# Patient Record
Sex: Female | Born: 1965 | Race: White | Hispanic: No | Marital: Married
Health system: Southern US, Community
[De-identification: ages and names within clinical notes are randomized; demographics above are authoritative.]

---

## 1997-09-24 ENCOUNTER — Encounter: Admission: RE | Admit: 1997-09-24 | Discharge: 1997-12-23 | Payer: Self-pay | Admitting: Gynecology

## 1998-02-04 ENCOUNTER — Encounter: Admission: RE | Admit: 1998-02-04 | Discharge: 1998-05-05 | Payer: Self-pay | Admitting: Gynecology

## 1998-04-04 ENCOUNTER — Inpatient Hospital Stay (HOSPITAL_COMMUNITY): Admission: AD | Admit: 1998-04-04 | Discharge: 1998-04-07 | Payer: Self-pay | Admitting: Gynecology

## 1998-04-08 ENCOUNTER — Encounter (HOSPITAL_COMMUNITY): Admission: RE | Admit: 1998-04-08 | Discharge: 1998-04-23 | Payer: Self-pay | Admitting: *Deleted

## 1998-05-19 ENCOUNTER — Other Ambulatory Visit: Admission: RE | Admit: 1998-05-19 | Discharge: 1998-05-19 | Payer: Self-pay | Admitting: Gynecology

## 2009-03-17 ENCOUNTER — Encounter: Admission: RE | Admit: 2009-03-17 | Discharge: 2009-03-17 | Payer: Self-pay | Admitting: Obstetrics and Gynecology

## 2010-02-06 ENCOUNTER — Encounter: Admission: RE | Admit: 2010-02-06 | Discharge: 2010-02-06 | Payer: Self-pay | Admitting: Orthopedic Surgery

## 2010-09-23 ENCOUNTER — Other Ambulatory Visit: Payer: Self-pay | Admitting: Obstetrics and Gynecology

## 2010-09-23 DIAGNOSIS — Z1231 Encounter for screening mammogram for malignant neoplasm of breast: Secondary | ICD-10-CM

## 2010-10-27 ENCOUNTER — Ambulatory Visit
Admission: RE | Admit: 2010-10-27 | Discharge: 2010-10-27 | Disposition: A | Discharge status: Home / Self Care | Payer: BC Managed Care – PPO | Source: Ambulatory Visit | Attending: Obstetrics and Gynecology | Admitting: Obstetrics and Gynecology

## 2010-10-27 DIAGNOSIS — Z1231 Encounter for screening mammogram for malignant neoplasm of breast: Secondary | ICD-10-CM

## 2011-07-14 ENCOUNTER — Other Ambulatory Visit: Payer: Self-pay | Admitting: Neurological Surgery

## 2011-07-14 DIAGNOSIS — M545 Low back pain: Secondary | ICD-10-CM

## 2011-07-18 ENCOUNTER — Ambulatory Visit
Admission: RE | Admit: 2011-07-18 | Discharge: 2011-07-18 | Disposition: A | Discharge status: Home / Self Care | Payer: BC Managed Care – PPO | Source: Ambulatory Visit | Attending: Neurological Surgery | Admitting: Neurological Surgery

## 2011-07-18 DIAGNOSIS — M549 Dorsalgia, unspecified: Secondary | ICD-10-CM

## 2011-07-18 DIAGNOSIS — M545 Low back pain: Secondary | ICD-10-CM

## 2011-07-18 MED ORDER — IOHEXOL 180 MG/ML  SOLN
1.0000 mL | Freq: Once | INTRAMUSCULAR | Status: AC | PRN
  Administered 2011-07-18: 1 mL via EPIDURAL

## 2011-07-18 MED ORDER — METHYLPREDNISOLONE ACETATE 40 MG/ML INJ SUSP (RADIOLOG
120.0000 mg | Freq: Once | INTRAMUSCULAR | Status: AC
  Administered 2011-07-18: 120 mg via EPIDURAL

## 2011-11-08 ENCOUNTER — Other Ambulatory Visit: Payer: Self-pay | Admitting: Obstetrics and Gynecology

## 2011-11-08 DIAGNOSIS — Z1231 Encounter for screening mammogram for malignant neoplasm of breast: Secondary | ICD-10-CM

## 2011-11-23 ENCOUNTER — Ambulatory Visit
Admission: RE | Admit: 2011-11-23 | Discharge: 2011-11-23 | Disposition: A | Discharge status: Home / Self Care | Payer: BC Managed Care – PPO | Source: Ambulatory Visit | Attending: Obstetrics and Gynecology | Admitting: Obstetrics and Gynecology

## 2011-11-23 DIAGNOSIS — Z1231 Encounter for screening mammogram for malignant neoplasm of breast: Secondary | ICD-10-CM

## 2012-06-30 ENCOUNTER — Encounter (HOSPITAL_COMMUNITY): Payer: Self-pay | Admitting: Emergency Medicine

## 2012-06-30 ENCOUNTER — Emergency Department (HOSPITAL_COMMUNITY)
Admission: EM | Admit: 2012-06-30 | Discharge: 2012-06-30 | Disposition: A | Discharge status: Home Health | Payer: BC Managed Care – PPO | Attending: Emergency Medicine | Admitting: Emergency Medicine

## 2012-06-30 DIAGNOSIS — R040 Epistaxis: Secondary | ICD-10-CM | POA: Insufficient documentation

## 2012-06-30 DIAGNOSIS — J3489 Other specified disorders of nose and nasal sinuses: Secondary | ICD-10-CM | POA: Insufficient documentation

## 2012-06-30 MED ORDER — OXYMETAZOLINE HCL 0.05 % NA SOLN: 2.0000 | Freq: Two times a day (BID) | NASAL | Status: DC

## 2012-06-30 NOTE — ED Notes (Signed)
Pt presents to ED via POV with c/o nosebleeds approx 6-8 in the past 24 hours. No bleeding noted on arrival to ED. NAD.

## 2012-06-30 NOTE — ED Provider Notes (Signed)
History     CSN: [id number]  Arrival date & time 06/30/12  0707   First MD Initiated Contact with Patient 06/30/12 0715      Chief Complaint  Patient presents with  . Epistaxis    (Consider location/radiation/quality/duration/timing/severity/associated sxs/prior treatment) HPI Comments: Patient with complaints of several nosebleeds over the past 24 hours. She is not currently bleeding. She does not typically have nosebleeds and she's not on any blood thinning medications. Patient states that she can control symptoms after several minutes of pressure. She has had several tissues full of blood at times with clotting. Patient states that initially the blood was coming from the right nare however more recently it was coming out of both. She has not felt lightheaded or passed out. No other treatments other than pressure. She has had sinusitis symptoms as of the past few days. No other complaints. Onset acute. Course is resolved. Nothing makes symptoms better or worse.  The history is provided by the patient.    History reviewed. No pertinent past medical history.  History reviewed. No pertinent past surgical history.  History reviewed. No pertinent family history.  History  Substance Use Topics  . Smoking status: Not on file  . Smokeless tobacco: Not on file  . Alcohol Use: Not on file    OB History    Grav Para Term Preterm Abortions TAB SAB Ect Mult Living                  Review of Systems  Constitutional: Negative for fever, chills and fatigue.  HENT: Positive for nosebleeds, congestion, rhinorrhea and sinus pressure. Negative for ear pain, sore throat and neck stiffness.   Respiratory: Negative for shortness of breath.   Gastrointestinal: Negative for nausea and vomiting.  Skin: Negative for color change.  Neurological: Negative for syncope and light-headedness.    Allergies  Review of patient's allergies indicates no known allergies.  Home Medications   Current  Outpatient Rx  Name  Route  Sig  Dispense  Refill  . OXYMETAZOLINE HCL 0.05 % NA SOLN   Nasal   Place 2 sprays into the nose 2 (two) times daily.   30 mL   0     Do not use more than 3 consecutive days.     BP 131/84  Pulse 70  Temp 97.6 F (36.4 C) (Oral)  Resp 17  SpO2 100%  Physical Exam  Nursing note and vitals reviewed. Constitutional: She appears well-developed and well-nourished.  HENT:  Head: Normocephalic and atraumatic. No trismus in the jaw.  Right Ear: Tympanic membrane, external ear and ear canal normal.  Left Ear: Tympanic membrane, external ear and ear canal normal.  Nose: Mucosal edema and rhinorrhea present. No epistaxis.  Mouth/Throat: Uvula is midline, oropharynx is clear and moist and mucous membranes are normal. Mucous membranes are not dry. No oral lesions. No uvula swelling. No oropharyngeal exudate, posterior oropharyngeal edema, posterior oropharyngeal erythema or tonsillar abscesses.       Blood vessels inside right nare anteriorly appear engorged however no bleeding lesions noted in either nare. No active bleeding.  Eyes: Conjunctivae normal are normal. Right eye exhibits no discharge. Left eye exhibits no discharge.       No conjunctival pallor.  Neck: Normal range of motion. Neck supple.  Cardiovascular: Normal rate and regular rhythm.   Lymphadenopathy:    She has no cervical adenopathy.  Neurological: She is alert.  Skin: Skin is warm and dry.  Psychiatric: She has  a normal mood and affect.    ED Course  Procedures (including critical care time)  Labs Reviewed - No data to display No results found.   1. Epistaxis     7:34 AM Patient seen and examined. Patient counseled to use Afrin nasal spray twice a day for 3 days. She will also use petroleum based products to moisturize inside of nose. Counseled on techniques to stop nosebleed.   Vital signs reviewed and are as follows: Filed Vitals:   06/30/12 0712  BP: 131/84  Pulse: 70    Temp: 97.6 F (36.4 C)  Resp: 17      MDM  Patient with recent nosebleeds, well controlled in emergency department. Suspect posterior nosebleed given history and exam. This is likely due to a sinus infection and cold dry air. Patient is not on any blood thinners. She does not exhibit any signs or symptoms of anemia.        Renne Crigler, [place] 06/30/12 [phone]

## 2012-06-30 NOTE — ED Notes (Signed)
Pt discharged to home NAD.  

## 2012-07-01 ENCOUNTER — Emergency Department (HOSPITAL_COMMUNITY)
Admission: EM | Admit: 2012-07-01 | Discharge: 2012-07-01 | Disposition: A | Discharge status: Home / Self Care | Payer: BC Managed Care – PPO | Source: Home / Self Care | Attending: Emergency Medicine | Admitting: Emergency Medicine

## 2012-07-01 ENCOUNTER — Encounter (HOSPITAL_COMMUNITY): Payer: Self-pay | Admitting: *Deleted

## 2012-07-01 DIAGNOSIS — R040 Epistaxis: Secondary | ICD-10-CM

## 2012-07-01 NOTE — ED Provider Notes (Signed)
Medical screening examination/treatment/procedure(s) were performed by non-physician practitioner and as supervising physician I was immediately available for consultation/collaboration.  Hillery Zachman, MD 07/01/12 1055 

## 2012-07-01 NOTE — ED Provider Notes (Signed)
History     CSN: [id number]  Arrival date & time 07/01/12  1316   First MD Initiated Contact with Patient 07/01/12 1346      Chief Complaint  Patient presents with  . Epistaxis    (Consider location/radiation/quality/duration/timing/severity/associated sxs/prior treatment) HPI Comments: Patient presents urgent care describing that she continues to have episodes of bleeding she suspects mainly coming from the right side of her nose. This morning she had about 2-3 episodes were easily controllable with pinching her nose and applying ice packs and she's still using Afrin as instructed by the emergency department. Patient denies any other symptoms such as bruising, dizziness, fevers, headaches,  "I'm just wondered if there something else we can do"  Patient is a 47 y.o. female presenting with nosebleeds. The history is provided by the patient.  Epistaxis  This is a recurrent problem. The current episode started 2 days ago. The problem occurs daily. The problem has not changed since onset.Associated with: Recent cold like symptoms. Treatments tried: Afrin. The treatment provided mild relief.    History reviewed. No pertinent past medical history.  History reviewed. No pertinent past surgical history.  No family history on file.  History  Substance Use Topics  . Smoking status: Not on file  . Smokeless tobacco: Not on file  . Alcohol Use: Not on file    OB History    Grav Para Term Preterm Abortions TAB SAB Ect Mult Living                  Review of Systems  Constitutional: Negative for chills, activity change and appetite change.  HENT: Positive for nosebleeds.   Gastrointestinal: Negative for nausea and rectal pain.  Skin: Negative for color change, pallor, rash and wound.  Neurological: Negative for dizziness and headaches.    Allergies  Review of patient's allergies indicates no known allergies.  Home Medications   Current Outpatient Rx  Name  Route  Sig   Dispense  Refill  . CYCLOBENZAPRINE HCL 10 MG PO TABS   Oral   Take 10 mg by mouth 3 (three) times daily as needed.         Marland Kitchen OXYMETAZOLINE HCL 0.05 % NA SOLN   Nasal   Place 2 sprays into the nose 2 (two) times daily.   30 mL   0     Do not use more than 3 consecutive days.     BP 120/81  Pulse 64  Temp 97.9 F (36.6 C) (Oral)  Resp 16  SpO2 100%  Physical Exam  Nursing note and vitals reviewed. Constitutional: She appears well-developed and well-nourished. No distress.  HENT:  Head: Normocephalic.  Nose: No mucosal edema, rhinorrhea, nose lacerations, sinus tenderness, nasal deformity, septal deviation or nasal septal hematoma. No epistaxis.    Mouth/Throat: No oropharyngeal exudate.  Eyes: Conjunctivae normal and EOM are normal. Pupils are equal, round, and reactive to light. Right eye exhibits no discharge. Left eye exhibits no discharge. No scleral icterus.  Neck: Neck supple.  Neurological: She is alert.  Skin: No rash noted. No pallor.    ED Course  Procedures (including critical care time)  Labs Reviewed - No data to display No results found.   1. Epistaxis, recurrent       MDM  During urgent care encounter patient had no active bleeding. Mild erythema noted to right nostril. No blood clots. No evidence of recent post pharyngeal evidence of bleeding. We discuss further measures to take to prevent  recurrences and episodes. Including aggressive nasal mucosa hydration with the use of normal saline and Vaseline to prevent physical efforts, protect herself against extreme temperature changes of rest heat and cold. Patient understands we'll pursue further precautionary measures. No active epistaxis        Jimmie Molly, MD 07/01/12 1557

## 2012-07-01 NOTE — ED Notes (Signed)
Patient complains of nose bleeds starting Thursday. Patient states she went to the ED on Saturday after having 12 or more nose bleeds in a row from Friday to Saturday was sent home with afrin. This morning patient stats she had 3 more nose bleeds that wouldn't stop. Patient denies nausea, vomiting, diarrhea, fever/chills.

## 2012-12-10 ENCOUNTER — Other Ambulatory Visit: Payer: Self-pay

## 2012-12-10 DIAGNOSIS — Z1231 Encounter for screening mammogram for malignant neoplasm of breast: Secondary | ICD-10-CM

## 2013-01-10 ENCOUNTER — Ambulatory Visit
Admission: RE | Admit: 2013-01-10 | Discharge: 2013-01-10 | Disposition: A | Discharge status: Home / Self Care | Payer: BC Managed Care – PPO | Source: Ambulatory Visit

## 2013-01-10 DIAGNOSIS — Z1231 Encounter for screening mammogram for malignant neoplasm of breast: Secondary | ICD-10-CM

## 2013-12-09 ENCOUNTER — Other Ambulatory Visit: Payer: Self-pay

## 2013-12-09 DIAGNOSIS — Z1231 Encounter for screening mammogram for malignant neoplasm of breast: Secondary | ICD-10-CM

## 2014-02-04 ENCOUNTER — Ambulatory Visit
Admission: RE | Admit: 2014-02-04 | Discharge: 2014-02-04 | Disposition: A | Discharge status: Home / Self Care | Payer: BC Managed Care – PPO | Source: Ambulatory Visit

## 2014-02-04 DIAGNOSIS — Z1231 Encounter for screening mammogram for malignant neoplasm of breast: Secondary | ICD-10-CM

## 2015-03-24 ENCOUNTER — Other Ambulatory Visit: Payer: Self-pay

## 2015-03-24 DIAGNOSIS — Z1231 Encounter for screening mammogram for malignant neoplasm of breast: Secondary | ICD-10-CM

## 2015-04-20 ENCOUNTER — Ambulatory Visit: Payer: Self-pay

## 2015-04-20 ENCOUNTER — Ambulatory Visit
Admission: RE | Admit: 2015-04-20 | Discharge: 2015-04-20 | Disposition: A | Discharge status: Home / Self Care | Payer: BLUE CROSS/BLUE SHIELD | Source: Ambulatory Visit

## 2015-04-20 DIAGNOSIS — Z1231 Encounter for screening mammogram for malignant neoplasm of breast: Secondary | ICD-10-CM

## 2016-06-15 ENCOUNTER — Other Ambulatory Visit: Payer: Self-pay | Admitting: Obstetrics and Gynecology

## 2016-06-15 DIAGNOSIS — Z1231 Encounter for screening mammogram for malignant neoplasm of breast: Secondary | ICD-10-CM

## 2016-07-11 ENCOUNTER — Ambulatory Visit: Payer: BLUE CROSS/BLUE SHIELD

## 2016-07-26 ENCOUNTER — Ambulatory Visit
Admission: RE | Admit: 2016-07-26 | Discharge: 2016-07-26 | Disposition: A | Discharge status: Home / Self Care | Payer: BLUE CROSS/BLUE SHIELD | Source: Ambulatory Visit | Attending: Obstetrics and Gynecology | Admitting: Obstetrics and Gynecology

## 2016-07-26 DIAGNOSIS — Z1231 Encounter for screening mammogram for malignant neoplasm of breast: Secondary | ICD-10-CM

## 2017-08-22 ENCOUNTER — Other Ambulatory Visit: Payer: Self-pay | Admitting: Obstetrics and Gynecology

## 2017-08-22 DIAGNOSIS — Z1231 Encounter for screening mammogram for malignant neoplasm of breast: Secondary | ICD-10-CM

## 2017-09-12 ENCOUNTER — Ambulatory Visit: Payer: BLUE CROSS/BLUE SHIELD

## 2018-06-04 DIAGNOSIS — M25775 Osteophyte, left foot: Secondary | ICD-10-CM | POA: Diagnosis not present

## 2018-06-04 DIAGNOSIS — L600 Ingrowing nail: Secondary | ICD-10-CM | POA: Diagnosis not present

## 2018-06-14 DIAGNOSIS — L600 Ingrowing nail: Secondary | ICD-10-CM | POA: Diagnosis not present

## 2019-02-26 DIAGNOSIS — H5203 Hypermetropia, bilateral: Secondary | ICD-10-CM | POA: Diagnosis not present

## 2019-03-06 ENCOUNTER — Other Ambulatory Visit: Payer: Self-pay | Admitting: Obstetrics and Gynecology

## 2019-03-06 DIAGNOSIS — Z1231 Encounter for screening mammogram for malignant neoplasm of breast: Secondary | ICD-10-CM

## 2019-04-23 ENCOUNTER — Ambulatory Visit
Admission: RE | Admit: 2019-04-23 | Discharge: 2019-04-23 | Disposition: A | Discharge status: Home / Self Care | Payer: BLUE CROSS/BLUE SHIELD | Source: Ambulatory Visit | Attending: Obstetrics and Gynecology | Admitting: Obstetrics and Gynecology

## 2019-04-23 ENCOUNTER — Other Ambulatory Visit: Payer: Self-pay

## 2019-04-23 DIAGNOSIS — Z1231 Encounter for screening mammogram for malignant neoplasm of breast: Secondary | ICD-10-CM

## 2019-09-19 DIAGNOSIS — Z0000 Encounter for general adult medical examination without abnormal findings: Secondary | ICD-10-CM | POA: Diagnosis not present

## 2019-09-19 DIAGNOSIS — Z01419 Encounter for gynecological examination (general) (routine) without abnormal findings: Secondary | ICD-10-CM | POA: Diagnosis not present

## 2019-09-19 DIAGNOSIS — Z136 Encounter for screening for cardiovascular disorders: Secondary | ICD-10-CM | POA: Diagnosis not present

## 2019-09-19 DIAGNOSIS — Z23 Encounter for immunization: Secondary | ICD-10-CM | POA: Diagnosis not present

## 2019-09-24 DIAGNOSIS — R5383 Other fatigue: Secondary | ICD-10-CM | POA: Diagnosis not present

## 2019-09-24 DIAGNOSIS — Z131 Encounter for screening for diabetes mellitus: Secondary | ICD-10-CM | POA: Diagnosis not present

## 2019-09-24 DIAGNOSIS — Z136 Encounter for screening for cardiovascular disorders: Secondary | ICD-10-CM | POA: Diagnosis not present

## 2019-09-24 DIAGNOSIS — Z0000 Encounter for general adult medical examination without abnormal findings: Secondary | ICD-10-CM | POA: Diagnosis not present

## 2020-05-13 ENCOUNTER — Other Ambulatory Visit: Payer: Self-pay | Admitting: Internal Medicine

## 2020-05-13 DIAGNOSIS — Z1231 Encounter for screening mammogram for malignant neoplasm of breast: Secondary | ICD-10-CM

## 2020-05-14 ENCOUNTER — Ambulatory Visit
Admission: RE | Admit: 2020-05-14 | Discharge: 2020-05-14 | Disposition: A | Discharge status: Home / Self Care | Payer: BC Managed Care – PPO | Source: Ambulatory Visit | Attending: Internal Medicine | Admitting: Internal Medicine

## 2020-05-14 ENCOUNTER — Other Ambulatory Visit: Payer: Self-pay

## 2020-05-14 DIAGNOSIS — Z1231 Encounter for screening mammogram for malignant neoplasm of breast: Secondary | ICD-10-CM

## 2020-09-29 IMAGING — MG DIGITAL SCREENING BILAT W/ TOMO
8 series · 8 of 24 positions shown · non-contrast
Comparison: Previous exam(s).

CLINICAL DATA: Screening.

EXAM:
DIGITAL SCREENING BILATERAL MAMMOGRAM WITH TOMO AND CAD

[R MLO synth-2D]
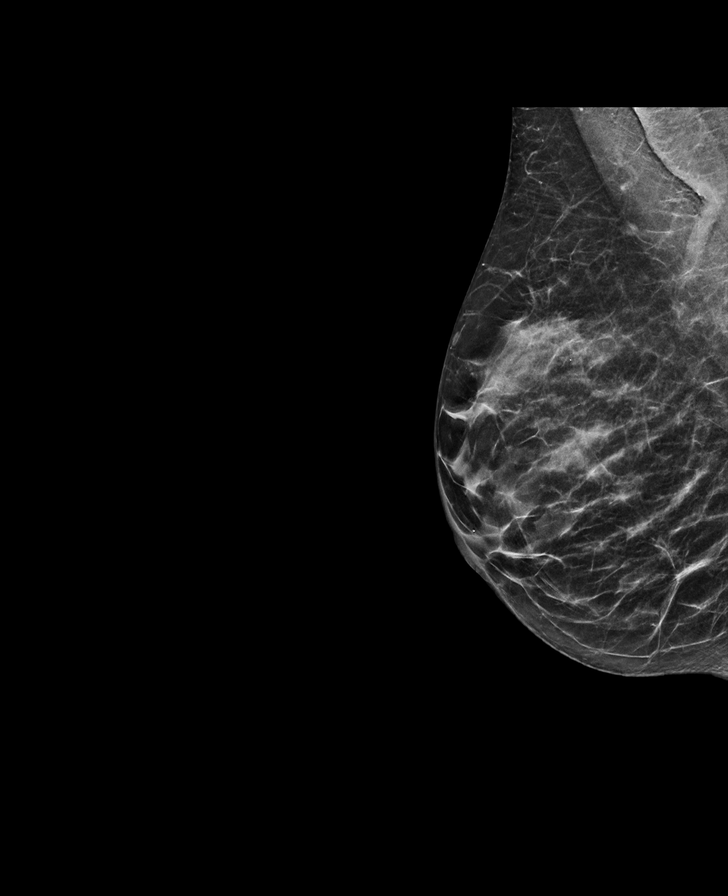

[R CC synth-2D]
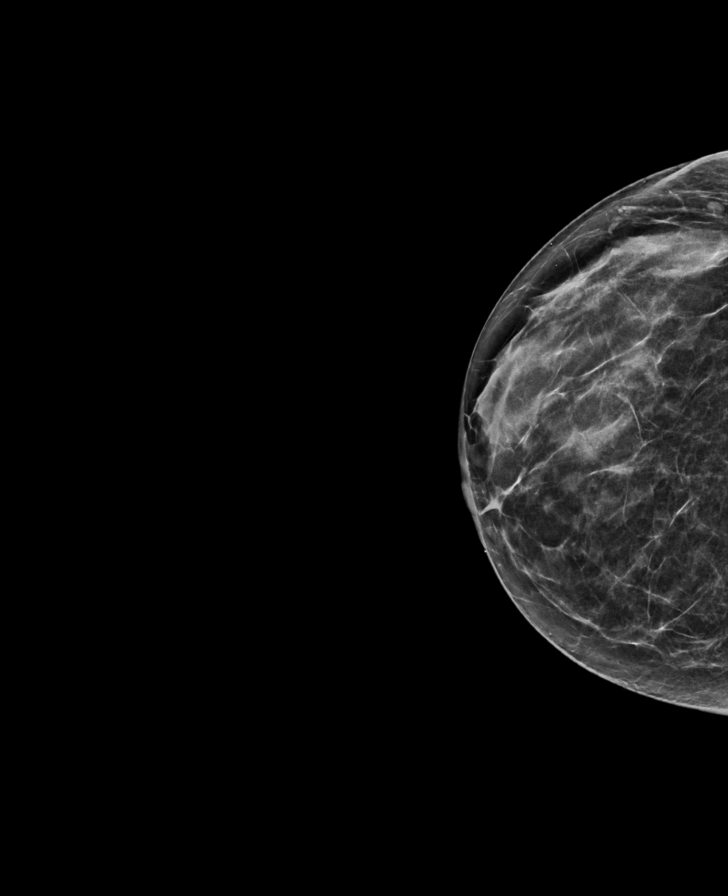

[L CC synth-2D]
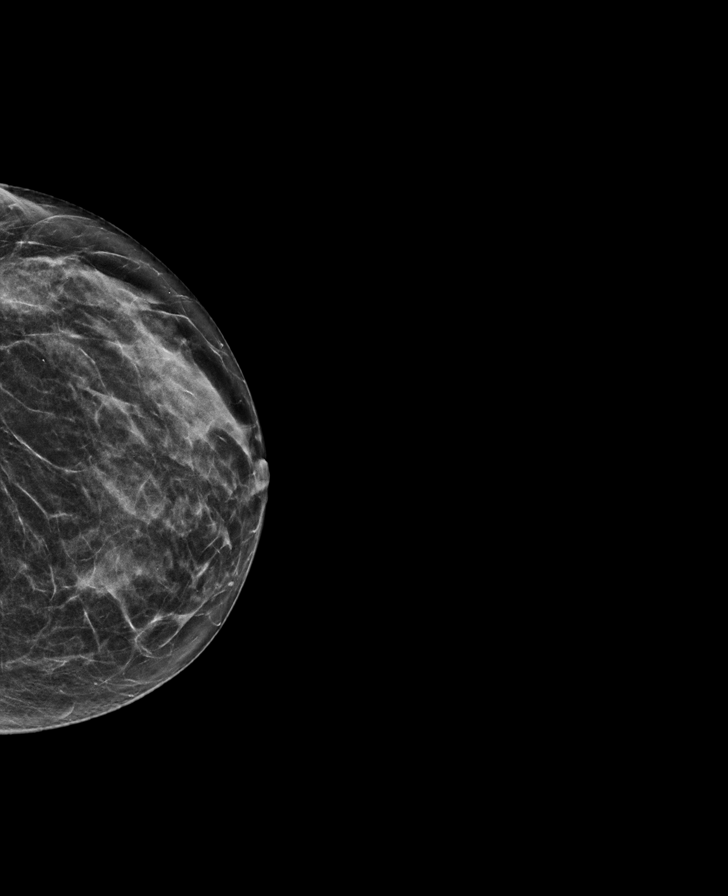

[L MLO synth-2D]
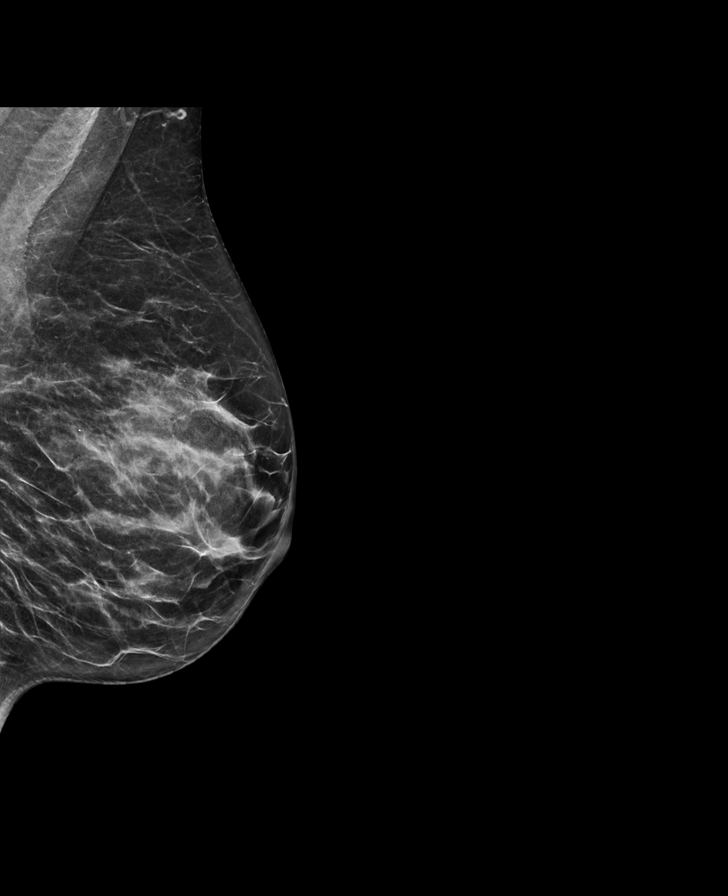

[L MLO tomo · tomo slice 29/58.0]
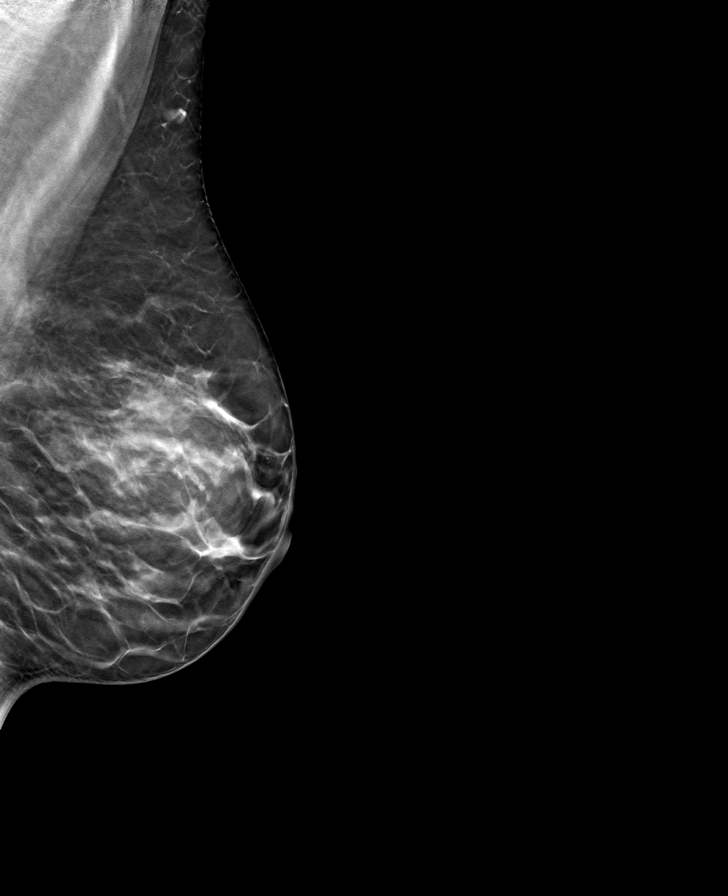

[L CC tomo · tomo slice 28/55.0]
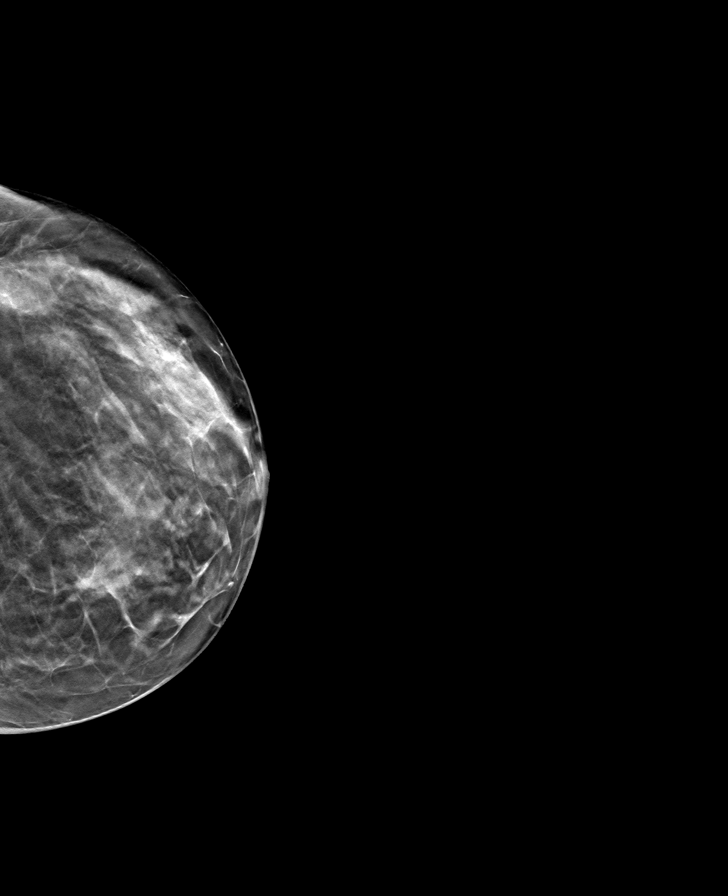

[R MLO tomo · tomo slice 27/52.0]
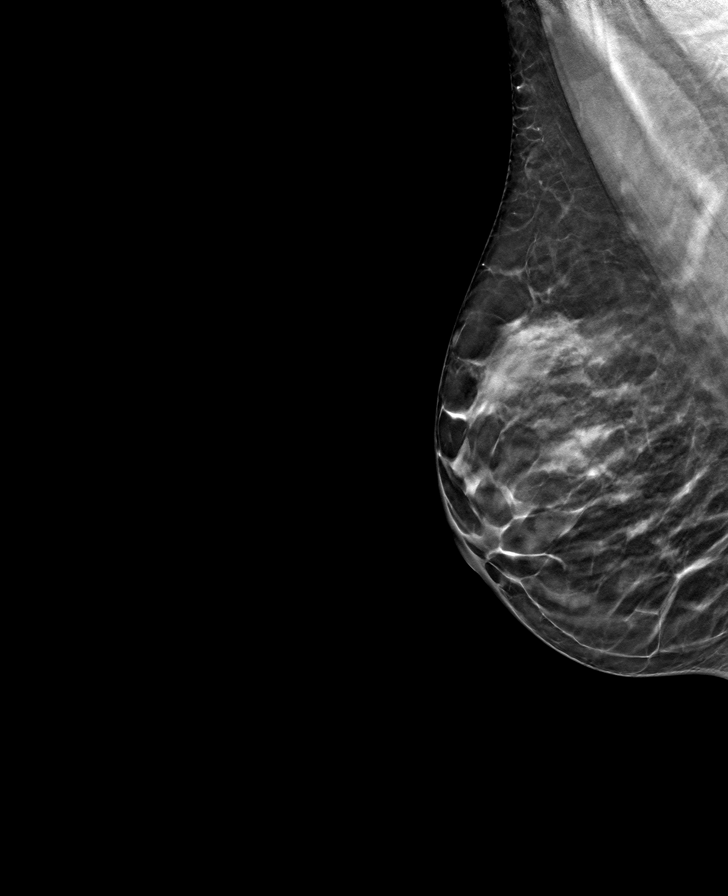

[R CC tomo · tomo slice 28/55.0]
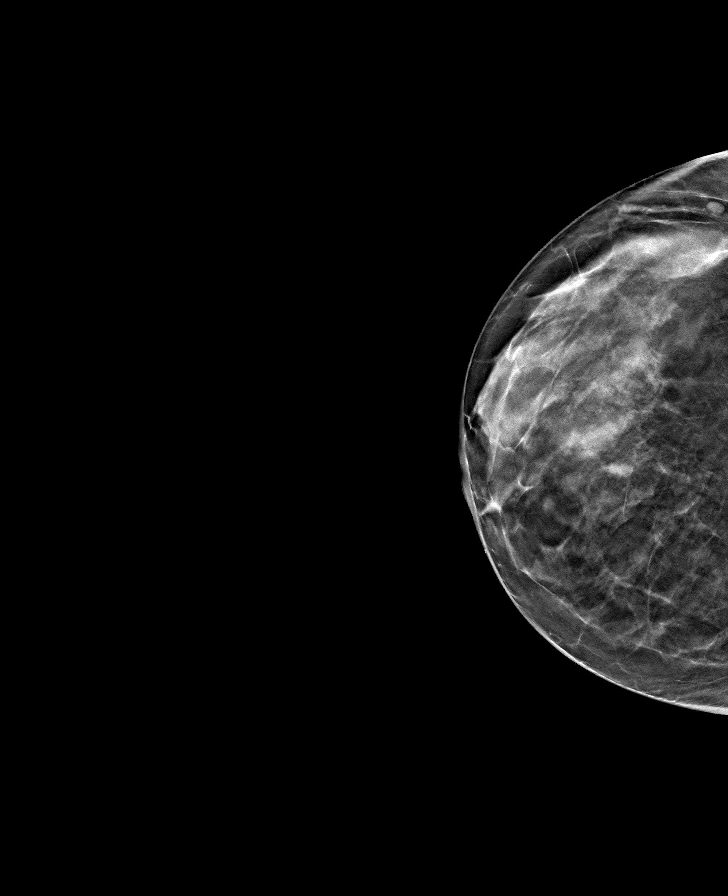

[8 of 24 positions shown; findings below may reference images not displayed]

ACR Breast Density Category c: The breast tissue is heterogeneously
dense, which may obscure small masses.
FINDINGS: There are no findings suspicious for malignancy. Images were
processed with CAD.
IMPRESSION: No mammographic evidence of malignancy. A result letter of this
screening mammogram will be mailed directly to the patient.

RECOMMENDATION:
Screening mammogram in one year. (Code:FT-U-LHB)

BI-RADS CATEGORY  1: Negative.

## 2021-08-26 ENCOUNTER — Other Ambulatory Visit: Payer: Self-pay | Admitting: Internal Medicine

## 2021-08-26 DIAGNOSIS — Z1231 Encounter for screening mammogram for malignant neoplasm of breast: Secondary | ICD-10-CM

## 2021-09-02 ENCOUNTER — Ambulatory Visit
Admission: RE | Admit: 2021-09-02 | Discharge: 2021-09-02 | Disposition: A | Discharge status: Home / Self Care | Payer: 59 | Source: Ambulatory Visit | Attending: Internal Medicine | Admitting: Internal Medicine

## 2021-09-02 DIAGNOSIS — Z1231 Encounter for screening mammogram for malignant neoplasm of breast: Secondary | ICD-10-CM

## 2022-06-10 DIAGNOSIS — M79671 Pain in right foot: Secondary | ICD-10-CM | POA: Diagnosis not present

## 2022-06-28 DIAGNOSIS — Z23 Encounter for immunization: Secondary | ICD-10-CM | POA: Diagnosis not present

## 2022-07-01 DIAGNOSIS — L821 Other seborrheic keratosis: Secondary | ICD-10-CM | POA: Diagnosis not present

## 2022-07-01 DIAGNOSIS — D225 Melanocytic nevi of trunk: Secondary | ICD-10-CM | POA: Diagnosis not present

## 2022-07-01 DIAGNOSIS — D220 Melanocytic nevi of lip: Secondary | ICD-10-CM | POA: Diagnosis not present

## 2022-07-01 DIAGNOSIS — L905 Scar conditions and fibrosis of skin: Secondary | ICD-10-CM | POA: Diagnosis not present

## 2022-07-01 DIAGNOSIS — L814 Other melanin hyperpigmentation: Secondary | ICD-10-CM | POA: Diagnosis not present

## 2022-07-01 DIAGNOSIS — D2272 Melanocytic nevi of left lower limb, including hip: Secondary | ICD-10-CM | POA: Diagnosis not present

## 2022-07-01 DIAGNOSIS — D224 Melanocytic nevi of scalp and neck: Secondary | ICD-10-CM | POA: Diagnosis not present

## 2022-07-01 DIAGNOSIS — D485 Neoplasm of uncertain behavior of skin: Secondary | ICD-10-CM | POA: Diagnosis not present

## 2022-07-15 ENCOUNTER — Ambulatory Visit (INDEPENDENT_AMBULATORY_CARE_PROVIDER_SITE_OTHER): Payer: 59

## 2022-07-15 ENCOUNTER — Encounter: Payer: Self-pay | Admitting: Podiatry

## 2022-07-15 ENCOUNTER — Ambulatory Visit: Payer: 59 | Admitting: Podiatry

## 2022-07-15 DIAGNOSIS — M722 Plantar fascial fibromatosis: Secondary | ICD-10-CM

## 2022-07-15 DIAGNOSIS — M7671 Peroneal tendinitis, right leg: Secondary | ICD-10-CM

## 2022-07-15 MED ORDER — TRIAMCINOLONE ACETONIDE 10 MG/ML IJ SUSP
10.0000 mg | Freq: Once | INTRAMUSCULAR | Status: AC
  Administered 2022-07-15: 10 mg

## 2022-07-15 MED ORDER — DICLOFENAC SODIUM 75 MG PO TBEC: 75.0000 mg | DELAYED_RELEASE_TABLET | Freq: Two times a day (BID) | ORAL | 2 refills | Status: DC

## 2022-07-18 ENCOUNTER — Ambulatory Visit: Payer: Self-pay | Admitting: Podiatry

## 2022-07-18 NOTE — Progress Notes (Signed)
Subjective:   Patient ID: Kari Miranda, female   DOB: 57 y.o.   MRN: [id number]   HPI Patient presents stating that she has been training to do the [facility] and she has tried to increase her distance has had history of Planter fasciitis with moderate plantar discomfort but also now is developing quite a bit more pain in the lateral side of the foot with ambulation.  Patient does not smoke and is trying very hard to be active   Review of Systems  All other systems reviewed and are negative.       Objective:  Physical Exam Vitals and nursing note reviewed.  Constitutional:      Appearance: She is well-developed.  Pulmonary:     Effort: Pulmonary effort is normal.  Musculoskeletal:        General: Normal range of motion.  Skin:    General: Skin is warm.  Neurological:     Mental Status: She is alert.     Neurovascular status found to be intact muscle strength found to be adequate range of motion found to be within normal limits.  Patient is found to have mild to moderate discomfort plantar aspect right heel at the insertional point of the tendon into the calcaneus with inflammation fluid around the medial band and also has discomfort in the lateral side of the foot in the area of the peroneal insertion with no indication of muscle damage.  Good digital perfusion well-oriented x 3     Assessment:  Inflammatory condition with acute peroneal tendinitis chronic Planter fasciitis with probability for gait change with increased mileage and exercise     Plan:  H&P reviewed both conditions treatment options.  Will get a focus on the lateral side of the foot first and I went ahead and I reviewed viewed the x-rays I then did sterile prep and injected the sheath of the peroneal tendon right 3 mg dexamethasone Kenalog 5 mg Xylocaine instructed on ice therapy and dispensed a brace to hold up the side of the foot.  I discussed possibility long-term orthotics or other treatments and I  placed on diclofenac 75 mg twice daily.  X-rays were negative for signs of fracture of the area or bony pathology with small plantar spur noted

## 2022-07-27 ENCOUNTER — Ambulatory Visit: Payer: 59 | Admitting: Podiatry

## 2022-07-28 ENCOUNTER — Encounter: Payer: Self-pay | Admitting: Podiatry

## 2022-07-28 ENCOUNTER — Ambulatory Visit: Payer: 59 | Admitting: Podiatry

## 2022-07-28 DIAGNOSIS — M722 Plantar fascial fibromatosis: Secondary | ICD-10-CM | POA: Diagnosis not present

## 2022-07-28 DIAGNOSIS — M7671 Peroneal tendinitis, right leg: Secondary | ICD-10-CM | POA: Diagnosis not present

## 2022-08-01 NOTE — Progress Notes (Signed)
Subjective:   Patient ID: Kari Miranda, female   DOB: 57 y.o.   MRN: [id number]   HPI Patient presents stating that the foot is improving.  States that she still has some discomfort but overall she is doing better and she is very pleased with the response to the conservative treatment   ROS      Objective:  Physical Exam  Neuro vas scaler status intact diminished discomfort in the lateral side of the right foot and into the plantar heel.  Pain has receded by about 80 to 90% with still mild discomfort but improved from previous     Assessment:  Inflammatory tendinitis right peroneal and plantar fascia that is improving with previous treatment     Plan:  Reviewed condition recommended the continuation of conservative care anti-inflammatories ice therapy and supportive shoes.  Reappoint for [place] to recheck all questions answered at the current time

## 2022-08-03 ENCOUNTER — Other Ambulatory Visit: Payer: Self-pay | Admitting: Internal Medicine

## 2022-08-03 DIAGNOSIS — Z1231 Encounter for screening mammogram for malignant neoplasm of breast: Secondary | ICD-10-CM

## 2022-09-15 ENCOUNTER — Ambulatory Visit: Payer: 59

## 2022-09-20 DIAGNOSIS — Z23 Encounter for immunization: Secondary | ICD-10-CM | POA: Diagnosis not present

## 2022-10-20 ENCOUNTER — Encounter: Payer: Self-pay | Admitting: Podiatry

## 2022-10-20 ENCOUNTER — Ambulatory Visit: Payer: 59 | Admitting: Podiatry

## 2022-10-20 DIAGNOSIS — M7671 Peroneal tendinitis, right leg: Secondary | ICD-10-CM

## 2022-10-20 MED ORDER — TRIAMCINOLONE ACETONIDE 10 MG/ML IJ SUSP
10.0000 mg | Freq: Once | INTRAMUSCULAR | Status: AC
  Administered 2022-10-20: 10 mg

## 2022-10-20 NOTE — Progress Notes (Signed)
Subjective:   Patient ID: Kari Miranda, female   DOB: 57 y.o.   MRN: [id number]   HPI Patient states that her right foot has started to hurt again and she is leaving next Wednesday for her hike at the [facility].  States it has been sore and she like to get an injection and has a boot at home   ROS      Objective:  Physical Exam  Neurovascular status intact inflammation along the course of the peroneal tendon as it comes close to the base of fifth met right no tendon loss noted     Assessment:  Inflammatory tendinitis of the peroneal tendon right     Plan:  H&P reviewed did do a careful injection after explaining risk of this she 3 mg Dexasone Kenalog 5 mg Xylocaine begin boot usage and hopefully she will be in good shape for her walk and I do want her to take diclofenac with her on the trip

## 2022-11-22 DIAGNOSIS — Z0000 Encounter for general adult medical examination without abnormal findings: Secondary | ICD-10-CM | POA: Diagnosis not present

## 2022-11-23 ENCOUNTER — Encounter: Payer: Self-pay | Admitting: Podiatry

## 2022-11-23 ENCOUNTER — Ambulatory Visit (INDEPENDENT_AMBULATORY_CARE_PROVIDER_SITE_OTHER): Payer: 59 | Admitting: Podiatry

## 2022-11-23 DIAGNOSIS — T148XXA Other injury of unspecified body region, initial encounter: Secondary | ICD-10-CM

## 2022-11-23 DIAGNOSIS — M7671 Peroneal tendinitis, right leg: Secondary | ICD-10-CM | POA: Diagnosis not present

## 2022-11-23 NOTE — Progress Notes (Signed)
Subjective:   Patient ID: Brand Males, female   DOB: 57 y.o.   MRN: [id number]   HPI Patient states she did well for the 240 mile hike that she did in [place] but she has just in the last few days developed a lot of pain in the outside of the right foot and she stated that she seemed to turn it and she felt like something popped   ROS      Objective:  Physical Exam  Neurovascular status intact with strong possibility that there may be a tear of the peroneal tendon right secondary to trauma with patient exhibiting excessive activity secondary to what she has done over the last couple months     Assessment:  Strong probability of acute tear of the peroneal tendon right and also could have a interstitial tear versus a complete tear      Plan:  H&P reviewed and I applied air fracture walker for complete immobilization due to the possibility for complete tear sending for an MRI and I advised on ice therapy currently.  I did discuss tear and the possibility for repair in the future

## 2022-11-25 ENCOUNTER — Ambulatory Visit
Admission: RE | Admit: 2022-11-25 | Discharge: 2022-11-25 | Disposition: A | Discharge status: Home / Self Care | Payer: 59 | Source: Ambulatory Visit | Attending: Podiatry | Admitting: Podiatry

## 2022-11-25 DIAGNOSIS — M25571 Pain in right ankle and joints of right foot: Secondary | ICD-10-CM | POA: Diagnosis not present

## 2022-11-25 DIAGNOSIS — T148XXA Other injury of unspecified body region, initial encounter: Secondary | ICD-10-CM

## 2022-11-25 DIAGNOSIS — M7671 Peroneal tendinitis, right leg: Secondary | ICD-10-CM | POA: Diagnosis not present

## 2022-11-28 ENCOUNTER — Ambulatory Visit
Admission: RE | Admit: 2022-11-28 | Discharge: 2022-11-28 | Disposition: A | Discharge status: Home / Self Care | Payer: 59 | Source: Ambulatory Visit | Attending: Internal Medicine | Admitting: Internal Medicine

## 2022-11-28 DIAGNOSIS — Z1231 Encounter for screening mammogram for malignant neoplasm of breast: Secondary | ICD-10-CM

## 2022-12-05 ENCOUNTER — Encounter: Payer: Self-pay | Admitting: Podiatry

## 2022-12-05 ENCOUNTER — Ambulatory Visit: Payer: 59 | Admitting: Podiatry

## 2022-12-05 DIAGNOSIS — T148XXA Other injury of unspecified body region, initial encounter: Secondary | ICD-10-CM

## 2022-12-05 DIAGNOSIS — K5900 Constipation, unspecified: Secondary | ICD-10-CM | POA: Insufficient documentation

## 2022-12-05 DIAGNOSIS — Z860100 Personal history of colon polyps, unspecified: Secondary | ICD-10-CM | POA: Insufficient documentation

## 2022-12-05 DIAGNOSIS — M7671 Peroneal tendinitis, right leg: Secondary | ICD-10-CM

## 2022-12-05 DIAGNOSIS — Z1211 Encounter for screening for malignant neoplasm of colon: Secondary | ICD-10-CM | POA: Insufficient documentation

## 2022-12-05 DIAGNOSIS — Z800 Family history of malignant neoplasm of digestive organs: Secondary | ICD-10-CM | POA: Insufficient documentation

## 2022-12-05 DIAGNOSIS — K5904 Chronic idiopathic constipation: Secondary | ICD-10-CM | POA: Insufficient documentation

## 2022-12-05 DIAGNOSIS — R141 Gas pain: Secondary | ICD-10-CM | POA: Insufficient documentation

## 2022-12-05 DIAGNOSIS — R635 Abnormal weight gain: Secondary | ICD-10-CM | POA: Insufficient documentation

## 2022-12-05 NOTE — Progress Notes (Signed)
Subjective:   Patient ID: Kari Miranda, female   DOB: 57 y.o.   MRN: [id number]   HPI Patient had MRI done over a week ago states the boot is helping but still having a lot of pain and is only able to walk currently short distances without discomfort   ROS      Objective:  Physical Exam  Ocular status intact with patient found to have continued pain in the peroneal tendon as it comes under the lateral malleolus and inserts into the base of the fifth metatarsal.     Assessment:  Continued chronic pain right peroneal with possibility of interstitial tear     Plan:  H&P reviewed and we have yet to receive the report and until I have that I do not want to make complete diagnosis.  I did discuss that if it turns out there is not a tear we can do injection with immobilization and the possibility for orthotic treatment.  Patient to be seen and discussion to be commenced once I get the final report from radiology

## 2022-12-08 NOTE — Progress Notes (Signed)
Nice lady who loves to hike and has chronic lateral foot pain. I think will need repair of peroneal tendons. Do you want to see? I will call her today

## 2022-12-14 NOTE — Progress Notes (Signed)
I spoke with her and told her that DR. Ardelle Anton is going to see  her to discuss repair. I think she wants to wait until fall but I'd give her a call to check

## 2023-02-23 ENCOUNTER — Telehealth: Payer: Self-pay | Admitting: Podiatry

## 2023-02-23 NOTE — Telephone Encounter (Signed)
Dr Lilian Kapur

## 2023-02-23 NOTE — Telephone Encounter (Signed)
Pt is scheduled with Dr. Lilian Kapur on 9/9 at 9:15 am.

## 2023-02-23 NOTE — Telephone Encounter (Signed)
Pt called saying when she saw Dr. Charlsie Merles in June he was supposed to refer her to one of our other providers for surgery but she has not heard anything.

## 2023-03-06 ENCOUNTER — Ambulatory Visit: Payer: 59 | Admitting: Podiatry

## 2023-03-06 ENCOUNTER — Encounter: Payer: Self-pay | Admitting: Podiatry

## 2023-03-06 DIAGNOSIS — M25371 Other instability, right ankle: Secondary | ICD-10-CM | POA: Diagnosis not present

## 2023-03-06 DIAGNOSIS — S86311A Strain of muscle(s) and tendon(s) of peroneal muscle group at lower leg level, right leg, initial encounter: Secondary | ICD-10-CM

## 2023-03-06 NOTE — Patient Instructions (Signed)

## 2023-03-06 NOTE — Progress Notes (Signed)
  Subjective:  Patient ID: Kari Miranda, female    DOB: [birth year],  MRN: [id number]  Chief Complaint  Patient presents with   surgery consult    sx consult peroneal tendinitis right, torn tendon-Dr. Charlsie Merles refer to Dr. Lilian Kapur    57 y.o. female presents with the above complaint. History confirmed with patient.  Her tendon issues first began and became quite severe after a 240 mile walk/hike in [place].  She had 2 separate injections as well as boot immobilization as well.  MRI was completed in June.  Eventually her pain did improve quite a bit and she has now been able to resume walking for exercise up to 6 miles.  Still feels quite unstable on unsteady ground, has not resumed any impact or cutting activity that she would like to such as pickleball.  Objective:  Physical Exam: warm, good capillary refill, no trophic changes or ulcerative lesions, normal DP and PT pulses, normal sensory exam, and pain and tenderness along the peroneal tendons distal to the malleolus and proximal to the peroneal sulcus, no pain at the sulcus or in the retromalleolar groove or proximal to the malleolus, she says it is much improved palpation then it previously was   MPRESSION: 1. 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 2. 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 3. Mild peroneal tenosynovitis.     Electronically Signed   By: Elige Ko M.D.   On: 12/06/2022 05:30 Assessment:   1. Peroneal tendon tear, right, initial encounter   2. Ankle instability, right      Plan:  Patient was evaluated and treated and all questions answered.  I reviewed her MRI today and discussed the results and my personal impression of it.  We discussed the presence of the split tear in both peroneal tendons, treatment options for this including surgical  and nonsurgical treatment.  We discussed that so far she seems to have had significant improvement in progress with time and rest as well as corticosteroid injections and boot immobilization.  I suspect the acute tenosynovitis and bone marrow edema in the os peroneum has resolved and she still is dealing with the split tear as well as functional ankle instability.  I think likely at this point surgical intervention may not be necessary.  I recommended physical therapy for ankle stabilization and manual therapy for her peroneal tendinosis and rehabbing her tears with strengthening of the tendons.  We also discussed use of adjunct of treatment such as platelet rich plasma injections.  Referral for her PT was sent to [facility] physical therapy on [address], when she has her appointment scheduled she will let me know and we will schedule a PRP injection the week of 4 weeks prior to her physical therapy initialization.  I will see her back in 3 months for follow-up.  If she regresses or does not improve by this time, then we can revisit surgical repair  Return in about 3 months (around 06/05/2023) for f/u peroneal tear after PT and PRP injection .

## 2023-03-13 DIAGNOSIS — M25571 Pain in right ankle and joints of right foot: Secondary | ICD-10-CM | POA: Diagnosis not present

## 2023-03-14 ENCOUNTER — Ambulatory Visit (INDEPENDENT_AMBULATORY_CARE_PROVIDER_SITE_OTHER): Payer: 59 | Admitting: Podiatry

## 2023-03-14 DIAGNOSIS — S86311A Strain of muscle(s) and tendon(s) of peroneal muscle group at lower leg level, right leg, initial encounter: Secondary | ICD-10-CM

## 2023-03-14 DIAGNOSIS — M79673 Pain in unspecified foot: Secondary | ICD-10-CM

## 2023-03-14 NOTE — Progress Notes (Signed)
Patient presents today for PRP injection of the peroneal tendon of the right lower extremity.  Following consent and prepped with alcohol the right lower extremity was blocked with lidocaine and Marcaine in a field block along the peroneal tendon sheaths.  20 cc of whole blood was harvested from the antecubital fossa by my assistant and centrifuge to harvest 3cc of platelet rich plasma.  This was injected into the peroneal tendon sheath at the point maximal tenderness.  It was dressed with a bandage.  She starts physical therapy next week and will follow-up with me in 8 weeks to reevaluate.  Low impact exercise is okay to proceed with.

## 2023-03-20 DIAGNOSIS — M25571 Pain in right ankle and joints of right foot: Secondary | ICD-10-CM | POA: Diagnosis not present

## 2023-03-22 DIAGNOSIS — M25571 Pain in right ankle and joints of right foot: Secondary | ICD-10-CM | POA: Diagnosis not present

## 2023-03-27 DIAGNOSIS — M25571 Pain in right ankle and joints of right foot: Secondary | ICD-10-CM | POA: Diagnosis not present

## 2023-03-29 DIAGNOSIS — M25571 Pain in right ankle and joints of right foot: Secondary | ICD-10-CM | POA: Diagnosis not present

## 2023-04-03 DIAGNOSIS — M25571 Pain in right ankle and joints of right foot: Secondary | ICD-10-CM | POA: Diagnosis not present

## 2023-04-05 DIAGNOSIS — M25571 Pain in right ankle and joints of right foot: Secondary | ICD-10-CM | POA: Diagnosis not present

## 2023-05-09 ENCOUNTER — Ambulatory Visit: Payer: 59 | Admitting: Podiatry

## 2023-06-06 ENCOUNTER — Ambulatory Visit: Payer: 59 | Admitting: Podiatry

## 2023-06-06 DIAGNOSIS — S86311A Strain of muscle(s) and tendon(s) of peroneal muscle group at lower leg level, right leg, initial encounter: Secondary | ICD-10-CM

## 2023-06-06 NOTE — Progress Notes (Signed)
  Subjective:  Patient ID: Kari Miranda, female    DOB: 1966/04/13,  MRN: [id number]  Chief Complaint  Patient presents with   Foot Pain    RM3: f/u peroneal tear after PT and PRP injection .    57 y.o. female presents with the above complaint. History confirmed with patient.  She returns for follow-up she is doing much better still has occasional twinges of pain in the peroneal sulcus but was able to play pickle ball over Thanksgiving did note some discomfort while making cutting movements but was able to play and has been able to hike as well.  Physical Exam: warm, good capillary refill, no trophic changes or ulcerative lesions, normal DP and PT pulses, normal sensory exam, and today no pain on the peroneal tendons at only mild tenderness of the peroneal sulcus  MPRESSION: 1. 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 2. 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 3. Mild peroneal tenosynovitis.     Electronically Signed   By: Elige Ko M.D.   On: 12/06/2022 05:30 Assessment:   1. Peroneal tendon tear, right, initial encounter      Plan:  Patient was evaluated and treated and all questions answered.  Doing much better her PRP injection has been successful and she has completed physical therapy.  I have no further restrictions for her and she may continue her activity and sports as tolerated.  Return to see me.  If it worsens or does not improve.  No follow-ups on file.

## 2023-06-12 ENCOUNTER — Ambulatory Visit: Payer: 59 | Admitting: Podiatry

## 2023-07-04 DIAGNOSIS — D2261 Melanocytic nevi of right upper limb, including shoulder: Secondary | ICD-10-CM | POA: Diagnosis not present

## 2023-07-04 DIAGNOSIS — L821 Other seborrheic keratosis: Secondary | ICD-10-CM | POA: Diagnosis not present

## 2023-07-04 DIAGNOSIS — D220 Melanocytic nevi of lip: Secondary | ICD-10-CM | POA: Diagnosis not present

## 2023-07-04 DIAGNOSIS — L812 Freckles: Secondary | ICD-10-CM | POA: Diagnosis not present

## 2023-07-04 DIAGNOSIS — L245 Irritant contact dermatitis due to other chemical products: Secondary | ICD-10-CM | POA: Diagnosis not present

## 2023-07-04 DIAGNOSIS — D1801 Hemangioma of skin and subcutaneous tissue: Secondary | ICD-10-CM | POA: Diagnosis not present

## 2023-07-04 DIAGNOSIS — D2239 Melanocytic nevi of other parts of face: Secondary | ICD-10-CM | POA: Diagnosis not present

## 2023-07-04 DIAGNOSIS — D485 Neoplasm of uncertain behavior of skin: Secondary | ICD-10-CM | POA: Diagnosis not present

## 2023-07-04 DIAGNOSIS — L570 Actinic keratosis: Secondary | ICD-10-CM | POA: Diagnosis not present

## 2023-07-04 DIAGNOSIS — D225 Melanocytic nevi of trunk: Secondary | ICD-10-CM | POA: Diagnosis not present

## 2023-11-09 DIAGNOSIS — Z0001 Encounter for general adult medical examination with abnormal findings: Secondary | ICD-10-CM | POA: Diagnosis not present

## 2023-11-22 ENCOUNTER — Other Ambulatory Visit: Payer: Self-pay | Admitting: Internal Medicine

## 2023-11-22 DIAGNOSIS — Z1231 Encounter for screening mammogram for malignant neoplasm of breast: Secondary | ICD-10-CM

## 2023-11-29 ENCOUNTER — Ambulatory Visit
Admission: RE | Admit: 2023-11-29 | Discharge: 2023-11-29 | Disposition: A | Discharge status: Home / Self Care | Source: Ambulatory Visit | Attending: Internal Medicine | Admitting: Internal Medicine

## 2023-11-29 DIAGNOSIS — Z1231 Encounter for screening mammogram for malignant neoplasm of breast: Secondary | ICD-10-CM

## 2024-09-04 ENCOUNTER — Encounter: Admitting: Obstetrics and Gynecology
